# Patient Record
Sex: Male | Born: 1995 | Race: White | Hispanic: No | Marital: Married | Smoking: Never smoker
Health system: Southern US, Community
[De-identification: ages and names within clinical notes are randomized; demographics above are authoritative.]

## PROBLEM LIST (undated history)

## (undated) DIAGNOSIS — T7840XA Allergy, unspecified, initial encounter: Secondary | ICD-10-CM

## (undated) DIAGNOSIS — Z8709 Personal history of other diseases of the respiratory system: Secondary | ICD-10-CM

## (undated) DIAGNOSIS — J45909 Unspecified asthma, uncomplicated: Secondary | ICD-10-CM

## (undated) HISTORY — DX: Allergy, unspecified, initial encounter: T78.40XA

## (undated) HISTORY — DX: Personal history of other diseases of the respiratory system: Z87.09

## (undated) HISTORY — PX: TONSILLECTOMY: SUR1361

## (undated) HISTORY — PX: WISDOM TOOTH EXTRACTION: SHX21

## (undated) HISTORY — DX: Unspecified asthma, uncomplicated: J45.909

---
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
## 2018-11-22 ENCOUNTER — Encounter: Payer: Self-pay | Admitting: Family Medicine

## 2018-11-22 ENCOUNTER — Ambulatory Visit (INDEPENDENT_AMBULATORY_CARE_PROVIDER_SITE_OTHER): Payer: BC Managed Care – PPO | Admitting: Family Medicine

## 2018-11-22 ENCOUNTER — Ambulatory Visit: Payer: Self-pay | Admitting: Family Medicine

## 2018-11-22 VITALS — BP 128/80 | Temp 97.2°F | Ht 72.0 in | Wt 155.0 lb

## 2018-11-22 DIAGNOSIS — R531 Weakness: Secondary | ICD-10-CM | POA: Diagnosis not present

## 2018-11-22 DIAGNOSIS — R110 Nausea: Secondary | ICD-10-CM | POA: Diagnosis not present

## 2018-11-22 MED ORDER — ONDANSETRON HCL 8 MG PO TABS: 8.0000 mg | ORAL_TABLET | Freq: Three times a day (TID) | ORAL | 0 refills | Status: DC | PRN

## 2018-11-22 NOTE — Assessment & Plan Note (Signed)
See a/p for episodic weakness zofran  obs for other symptoms

## 2018-11-22 NOTE — Progress Notes (Signed)
Virtual Visit via Video Note  I connected with Alexander Ochoa on 11/22/18 at 10:15 AM EDT by a video enabled telemedicine application and verified that I am speaking with the correct person using two identifiers.  Location: Patient: home Provider: office    I discussed the limitations of evaluation and management by telemedicine and the availability of in person appointments. The patient expressed understanding and agreed to proceed.  History of Present Illness: Here to est as a new patient   No doctor since peds - new /needs new doctor   2 d ago he felt very shaky and weak and nausea  98.2 at that time  Ate- better for a bit and then got worse (ate olive garden pasta)  Clammy and pale  He had been outdoors - did not feel too hot / was not working  This lasted from 3 in afternoon until the next day  He had gone from 10-5 pm prior without eating  No LOC  Now just nausea  BP and blood sugar were elevated  BP was 156/75 Blood sugar was 155 (post prandial)   No hx of fainting  Blood draws make him anxious  Has never donated blood   Glucose today fasting 95  Yesterday it was 109 non fasting   Eats pretty healthy   Has not vomited  He can eat - makes him a bit worse  Heavy foods make it worse  No heartburn and does not get often  No abd pain  Epigastric area feels empty   So far has not eaten today  No more shakiness  No stool changes  No blood in stool   No pain with urination   No cough or respiratory symptoms at all  No ST  No rash  No tick bites   No joint pain or swelling  A little back pain -normal for him /upper back -on and off after standing   No headache  He was mildly light headed when he was shaky-has been better since then  No weakness or trouble speaking No numbness or tingling   Not anxious now or then   In the past he has had anxiety  Has never had medication Saw a counselor 1-2 times in HS    Stress level- is somewhat stressed  -exams His exams finished -big relief  Work- around 33 hours a week  This is a good amount  Stress at work- co workers difficult  Work load is high-under a lot of pressure   ? 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  HENT: Negative for congestion, sinus pain and sore throat.   Eyes: Negative for blurred vision, discharge and redness.  Respiratory: Negative for cough, shortness of breath and wheezing.   Cardiovascular: Negative for chest pain and palpitations.  Gastrointestinal: Positive for nausea. Negative for abdominal pain, blood in stool, constipation, diarrhea, heartburn, melena and vomiting.  Genitourinary: Negative for dysuria, frequency, hematuria and urgency.  Musculoskeletal: Negative for joint pain and myalgias.  Skin: Negative for rash.  Neurological: Negative for dizziness, tingling, speech change, focal weakness and headaches.  Endo/Heme/Allergies: Positive for environmental allergies. Negative for polydipsia. Does not bruise/bleed easily.  Psychiatric/Behavioral: Negative for depression and memory loss. The patient is nervous/anxious.    There are no active problems to display for this patient.  Past Medical History:  Diagnosis Date  . Allergy   . Asthma   . History of asthma     Social History   Tobacco Use  . Smoking status:  Never Smoker  . Smokeless tobacco: Never Used  Substance Use Topics  . Alcohol use: Never    Frequency: Never  . Drug use: Never   Family History  Problem Relation Age of Onset  . Hypertension Father   . High blood pressure Father   . Diabetes Father   . Hypertension Paternal Grandfather   . AAA (abdominal aortic aneurysm) Sister   . Breast cancer Maternal Aunt        under 30    No Known Allergies No current outpatient medications on file prior to visit.   No current facility-administered medications on file prior to visit.      Observations/Objective: Patient appears well, in no distress Weight is normal No facial swelling or asymmetry Normal voice-not hoarse and no slurred speech No obvious tremor or mobility impairment Moving neck and UEs normally Able to hear the call well  No cough or shortness of breath during interview  Talkative and mentally sharp with no cognitive changes No skin changes on face or neck , no rash or pallor Affect is mildly blunted (not overtly depressed or anxious appearing)   Assessment and Plan: Problem List Items Addressed This Visit      Other   Episodic weakness    One episode of non focal weakness/pallor/shakey/nausea (no vomiting) Improved with eating then worse again with glucose of 155 pp (now normal) and elevated bp (now normal)  Currently nausea alone (mild and no vomiting and no other symptoms)  Diff diag incl hypoglycemia/heat stroke (exhaustion)/stress reaction /anxiety  Disc all poss  Also cannot completely r/o virus like covid -19 (this is doubtful however) inst to isolate at home Px sent for zofran Enc fluids/rest (inside air conditioning)/ relax/ catch up on sleep  Smaller more frequent meals with protein-check blood glucose if needed  Consider 3 h GTT in the future (also f/u in office for exam)  Can return to work Thursday if feels 100% better  He will call us wed to update If worse in meantime will call and seek  care      Nausea    See a/p for episodic weakness zofran  obs for other symptoms         Follow Up Instructions: I am sending zofran in for nausea  Unsure what your symptoms came/are coming from  We do need to watch for viral symptoms of covid -19- please watch for diarrhea/vomiting/cough/shortness of breath or elevated temp and let us know  Hypoglycemia, heat stroke and anxiety could have all contributed as well  Once nausea is under control-drink fluids and stay cool (in the house) Eat smaller more frequent meals with protein  Keep me posted  Let's keep you out of work until Thursday  Call and update me with your symptom status on Wednesday (or earlier if worse)  Take care of yourself I would like to get a 3 hour glucose tolerance test in the future when we can set it up   I discussed the assessment and treatment plan with the patient. The patient was provided an opportunity to ask questions and all were answered. The patient agreed with the plan and demonstrated an understanding of the instructions.   The patient was advised to call back or seek an in-person evaluation if the symptoms worsen or if the condition fails to improve as anticipated.     Roxy Manns, MD

## 2018-11-22 NOTE — Assessment & Plan Note (Addendum)
One episode of non focal weakness/pallor/shakey/nausea (no vomiting) Improved with eating then worse again with glucose of 155 pp (now normal) and elevated bp (now normal)  Currently nausea alone (mild and no vomiting and no other symptoms)  Diff diag incl hypoglycemia/heat stroke (exhaustion)/stress reaction /anxiety  Disc all poss  Also cannot completely r/o virus like covid -19 (this is doubtful however) inst to isolate at home Px sent for zofran Enc fluids/rest (inside air conditioning)/ relax/ catch up on sleep  Smaller more frequent meals with protein-check blood glucose if needed  Consider 3 h GTT in the future (also f/u in office for exam)  Can return to work Thursday if feels 100% better  He will call us wed to update If worse in meantime will call and seek care

## 2018-11-22 NOTE — Patient Instructions (Signed)
I am sending zofran in for nausea  Unsure what your symptoms came/are coming from  We do need to watch for viral symptoms of covid -19- please watch for diarrhea/vomiting/cough/shortness of breath or elevated temp and let us know  Hypoglycemia, heat stroke and anxiety could have all contributed as well  Once nausea is under control-drink fluids and stay cool (in the house) Eat smaller more frequent meals with protein  Keep me posted  Let's keep you out of work until Thursday  Call and update me with your symptom status on Wednesday (or earlier if worse)  Take care of yourself I would like to get a 3 hour glucose tolerance test in the future when we can set it up

## 2019-09-12 ENCOUNTER — Telehealth: Payer: Self-pay | Admitting: Family Medicine

## 2019-09-12 DIAGNOSIS — Z0000 Encounter for general adult medical examination without abnormal findings: Secondary | ICD-10-CM | POA: Insufficient documentation

## 2019-09-12 NOTE — Telephone Encounter (Signed)
-----   Message from Aquilla Solian, RT sent at 09/01/2019  2:35 PM EST ----- Regarding: Lab Orders for Tuesday 3.9.2021 Please place lab orders for Tuesday 3.9.2021, office visit for physical on Tuesday 3.16.2021 Thank you, Jones Bales RT(R)

## 2019-09-13 ENCOUNTER — Other Ambulatory Visit: Payer: Self-pay

## 2019-09-13 ENCOUNTER — Other Ambulatory Visit (INDEPENDENT_AMBULATORY_CARE_PROVIDER_SITE_OTHER): Payer: BC Managed Care – PPO

## 2019-09-13 DIAGNOSIS — Z0000 Encounter for general adult medical examination without abnormal findings: Secondary | ICD-10-CM

## 2019-09-13 LAB — CBC WITH DIFFERENTIAL/PLATELET
Basophils Absolute: 0 10*3/uL (ref 0.0–0.1)
Basophils Relative: 0.7 % (ref 0.0–3.0)
Eosinophils Absolute: 0.1 10*3/uL (ref 0.0–0.7)
Eosinophils Relative: 1.3 % (ref 0.0–5.0)
HCT: 43.5 % (ref 39.0–52.0)
Hemoglobin: 14.8 g/dL (ref 13.0–17.0)
Lymphocytes Relative: 27.7 % (ref 12.0–46.0)
Lymphs Abs: 1.7 10*3/uL (ref 0.7–4.0)
MCHC: 34.1 g/dL (ref 30.0–36.0)
MCV: 89.7 fl (ref 78.0–100.0)
Monocytes Absolute: 0.5 10*3/uL (ref 0.1–1.0)
Monocytes Relative: 8 % (ref 3.0–12.0)
Neutro Abs: 3.7 10*3/uL (ref 1.4–7.7)
Neutrophils Relative %: 62.3 % (ref 43.0–77.0)
Platelets: 214 10*3/uL (ref 150.0–400.0)
RBC: 4.85 Mil/uL (ref 4.22–5.81)
RDW: 12.3 % (ref 11.5–15.5)
WBC: 6 10*3/uL (ref 4.0–10.5)

## 2019-09-13 LAB — COMPREHENSIVE METABOLIC PANEL
ALT: 18 U/L (ref 0–53)
AST: 19 U/L (ref 0–37)
Albumin: 4.9 g/dL (ref 3.5–5.2)
Alkaline Phosphatase: 62 U/L (ref 39–117)
BUN: 18 mg/dL (ref 6–23)
CO2: 29 mEq/L (ref 19–32)
Calcium: 10 mg/dL (ref 8.4–10.5)
Chloride: 102 mEq/L (ref 96–112)
Creatinine, Ser: 0.85 mg/dL (ref 0.40–1.50)
GFR: 111.27 mL/min (ref >60.00)
Glucose, Bld: 94 mg/dL (ref 70–99)
Potassium: 3.9 mEq/L (ref 3.5–5.1)
Sodium: 137 mEq/L (ref 135–145)
Total Bilirubin: 1.2 mg/dL (ref 0.2–1.2)
Total Protein: 7.7 g/dL (ref 6.0–8.3)

## 2019-09-13 LAB — LIPID PANEL
Cholesterol: 148 mg/dL (ref 0–200)
HDL: 61.8 mg/dL (ref >39.00)
LDL Cholesterol: 77 mg/dL (ref 0–99)
NonHDL: 86.16
Total CHOL/HDL Ratio: 2
Triglycerides: 47 mg/dL (ref 0.0–149.0)
VLDL: 9.4 mg/dL (ref 0.0–40.0)

## 2019-09-13 LAB — TSH: TSH: 1.75 u[IU]/mL (ref 0.35–4.50)

## 2019-09-20 ENCOUNTER — Encounter: Payer: Self-pay | Admitting: Family Medicine

## 2019-09-20 ENCOUNTER — Other Ambulatory Visit: Payer: Self-pay

## 2019-09-20 ENCOUNTER — Ambulatory Visit (INDEPENDENT_AMBULATORY_CARE_PROVIDER_SITE_OTHER): Payer: BC Managed Care – PPO | Admitting: Family Medicine

## 2019-09-20 VITALS — BP 130/80 | HR 73 | Temp 98.4°F | Ht 71.0 in | Wt 164.5 lb

## 2019-09-20 DIAGNOSIS — Z0000 Encounter for general adult medical examination without abnormal findings: Secondary | ICD-10-CM

## 2019-09-20 NOTE — Patient Instructions (Addendum)
Get a tetanus shot at work and let us know when it is   Go ahead and get the covid vaccines -now that you had covid over 3 months ago   Keep up the good work with healthy diet and exercise  Wear sun protection

## 2019-09-20 NOTE — Progress Notes (Signed)
Subjective:    Patient ID: Alexander Ochoa, male    DOB: 1995-10-11, 24 y.o.   MRN: [id number]  This visit occurred during the SARS-CoV-2 public health emergency.  Safety protocols were in place, including screening questions prior to the visit, additional usage of staff PPE, and extensive cleaning of exam room while observing appropriate contact time as indicated for disinfecting solutions.    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  Not a lot of time for anything else   Weight : Wt Readings from Last 3 Encounters:  09/20/19 164 lb 8 oz (74.6 kg)  11/22/18 155 lb (70.3 kg)   22.94 kg/m   Diet- eats pretty healthy  Has given up sweet drinks  Exercise - he does weight training when he can fit it in  More active in the spring /summer   HIV and STD screening -declines/low risk   Tetanus shot - plans to get at work   Flu shot - did not get this year but generally gets them every year   Family  History : father has lymphoma/ obesity/ DM /HTN  No colon or prostate cancer  Maunt had breast cancer   He feels like his LN go up and down in his neck   Had covid 19 in November  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)  BP Readings from Last 3 Encounters:  09/20/19 (!) 140/54  11/22/18 128/80  better on 2nd check  BP: 130/80   Pulse Readings from Last 3 Encounters:  09/20/19 73   Sometimes bp runs a little high at home  Usually stressed    Labs  Results for orders placed or performed in visit on 09/13/19  TSH  Result Value Ref Range   TSH 1.75 0.35 - 4.50 uIU/mL  Lipid panel  Result Value Ref Range   Cholesterol 148 0 - 200 mg/dL   Triglycerides 17.4 0.0 - 149.0 mg/dL   HDL 94.49 >67.59 mg/dL   VLDL 9.4 0.0 - 16.3 mg/dL   LDL Cholesterol 77 0 - 99 mg/dL   Total CHOL/HDL Ratio 2    NonHDL 86.16   Comprehensive metabolic panel  Result  Value Ref Range   Sodium 137 135 - 145 mEq/L   Potassium 3.9 3.5 - 5.1 mEq/L   Chloride 102 96 - 112 mEq/L   CO2 29 19 - 32 mEq/L   Glucose, Bld 94 70 - 99 mg/dL   BUN 18 6 - 23 mg/dL   Creatinine, Ser 8.46 0.40 - 1.50 mg/dL   Total Bilirubin 1.2 0.2 - 1.2 mg/dL   Alkaline Phosphatase 62 39 - 117 U/L   AST 19 0 - 37 U/L   ALT 18 0 - 53 U/L   Total Protein 7.7 6.0 - 8.3 g/dL   Albumin 4.9 3.5 - 5.2 g/dL   GFR 659.93 >57.01 mL/min   Calcium 10.0 8.4 - 10.5 mg/dL  CBC with Differential/Platelet  Result Value Ref Range   WBC 6.0 4.0 - 10.5 K/uL   RBC 4.85 4.22 - 5.81 Mil/uL   Hemoglobin 14.8 13.0 - 17.0 g/dL   HCT 77.9 39.0 - 30.0 %   MCV 89.7 78.0 - 100.0 fl   MCHC 34.1 30.0 - 36.0 g/dL   RDW 92.3 30.0 - 76.2 %   Platelets 214.0 150.0 - 400.0 K/uL   Neutrophils Relative %  62.3 43.0 - 77.0 %   Lymphocytes Relative 27.7 12.0 - 46.0 %   Monocytes Relative 8.0 3.0 - 12.0 %   Eosinophils Relative 1.3 0.0 - 5.0 %   Basophils Relative 0.7 0.0 - 3.0 %   Neutro Abs 3.7 1.4 - 7.7 K/uL   Lymphs Abs 1.7 0.7 - 4.0 K/uL   Monocytes Absolute 0.5 0.1 - 1.0 K/uL   Eosinophils Absolute 0.1 0.0 - 0.7 K/uL   Basophils Absolute 0.0 0.0 - 0.1 K/uL    Has some loss of interest /feeling down (worse at night)  Listens to music  Does not feel clinically depressed  No problems with sleep or appetite   No more weak spells   Patient Active Problem List   Diagnosis Date Noted  . Routine general medical examination at a health care facility 09/12/2019  . Episodic weakness 11/22/2018   Past Medical History:  Diagnosis Date  . Allergy   . Asthma   . History of asthma    Past Surgical History:  Procedure Laterality Date  . TONSILLECTOMY     age 33 or younger  . TONSILLECTOMY     Social History   Tobacco Use  . Smoking status: Never Smoker  . Smokeless tobacco: Never Used  Substance Use Topics  . Alcohol use: Never  . Drug use: Never   Family History  Problem Relation Age of Onset  .  Hypertension Father   . High blood pressure Father   . Diabetes Father   . Lymphoma Father   . Hypertension Paternal Grandfather   . Breast cancer Maternal Aunt        under 30    No Known Allergies Current Outpatient Medications on File Prior to Visit  Medication Sig Dispense Refill  . Ascorbic Acid (VITAMIN C PO) Take 1 tablet by mouth daily.    . Turmeric (QC TUMERIC COMPLEX PO) Take 1 tablet by mouth every other day.    Marland Kitchen VITAMIN D PO Take 1 tablet by mouth daily.     No current facility-administered medications on file prior to visit.    Review of Systems  Constitutional: Negative for activity change, appetite change, fatigue, fever and unexpected weight change.  HENT: Negative for congestion, rhinorrhea, sore throat and trouble swallowing.        Occ feels swollen LN? In neck  Eyes: Negative for pain, redness, itching and visual disturbance.  Respiratory: Negative for cough, chest tightness, shortness of breath and wheezing.   Cardiovascular: Negative for chest pain and palpitations.  Gastrointestinal: Negative for abdominal pain, blood in stool, constipation, diarrhea and nausea.  Endocrine: Negative for cold intolerance, heat intolerance, polydipsia and polyuria.  Genitourinary: Negative for difficulty urinating, dysuria, frequency and urgency.  Musculoskeletal: Negative for arthralgias, joint swelling and myalgias.  Skin: Negative for pallor and rash.  Neurological: Negative for dizziness, tremors, weakness, numbness and headaches.  Hematological: Negative for adenopathy. Does not bruise/bleed easily.  Psychiatric/Behavioral: Negative for decreased concentration and dysphoric mood. The patient is not nervous/anxious.        Objective:   Physical Exam Constitutional:      General: He is not in acute distress.    Appearance: Normal appearance. He is well-developed and normal weight. He is not ill-appearing or diaphoretic.  HENT:     Head: Normocephalic and atraumatic.       Right Ear: Tympanic membrane, ear canal and external ear normal.     Left Ear: Tympanic membrane, ear canal and external ear  normal.     Nose: Nose normal. No congestion.     Mouth/Throat:     Mouth: Mucous membranes are moist.     Pharynx: Oropharynx is clear. No posterior oropharyngeal erythema.  Eyes:     General: No scleral icterus.       Right eye: No discharge.        Left eye: No discharge.     Conjunctiva/sclera: Conjunctivae normal.     Pupils: Pupils are equal, round, and reactive to light.  Neck:     Thyroid: No thyromegaly.     Vascular: No carotid bruit or JVD.  Cardiovascular:     Rate and Rhythm: Normal rate and regular rhythm.     Pulses: Normal pulses.     Heart sounds: Normal heart sounds. No gallop.   Pulmonary:     Effort: Pulmonary effort is normal. No respiratory distress.     Breath sounds: Normal breath sounds. No wheezing or rales.     Comments: Good air exch Chest:     Chest wall: No tenderness.  Abdominal:     General: Bowel sounds are normal. There is no distension or abdominal bruit.     Palpations: Abdomen is soft. There is no mass.     Tenderness: There is no abdominal tenderness.     Hernia: No hernia is present.  Musculoskeletal:        General: No tenderness.     Cervical back: Normal range of motion and neck supple. No rigidity. No muscular tenderness.     Right lower leg: No edema.     Left lower leg: No edema.  Lymphadenopathy:     Head:     Right side of head: No submental, preauricular, posterior auricular or occipital adenopathy.     Left side of head: Submental adenopathy present. No preauricular, posterior auricular or occipital adenopathy.     Cervical: No cervical adenopathy.     Right cervical: No superficial, deep or posterior cervical adenopathy.    Left cervical: No superficial, deep or posterior cervical adenopathy.     Upper Body:     Right upper body: No supraclavicular, axillary or pectoral adenopathy.     Left upper  body: No supraclavicular, axillary, pectoral or epitrochlear adenopathy.     Lower Body: No right inguinal adenopathy. No left inguinal adenopathy.     Comments: One shotty mobile submental LN on the L   Skin:    General: Skin is warm and dry.     Coloration: Skin is not pale.     Findings: No erythema or rash.  Neurological:     Mental Status: He is alert.     Cranial Nerves: No cranial nerve deficit.     Motor: No abnormal muscle tone.     Coordination: Coordination normal.     Gait: Gait normal.     Deep Tendon Reflexes: Reflexes are normal and symmetric. Reflexes normal.  Psychiatric:        Mood and Affect: Mood normal.        Cognition and Memory: Cognition normal.           Assessment & Plan:   Problem List Items Addressed This Visit      Other   Routine general medical examination at a health care facility - Primary    Reviewed health habits including diet and exercise and skin cancer prevention Reviewed appropriate screening tests for age  Also reviewed health mt list, fam hx and immunization status ,  as well as social and family history    Good diet and exercise habits  No longer has any weakness spells Enc strongly to get the covid vaccines at work Also needs tetanus update-plans to get that  Usually gets flu shot yearly  Declines need for STD testing  BP here is better on 2nd check and will continue to monitor

## 2019-09-20 NOTE — Assessment & Plan Note (Signed)
Reviewed health habits including diet and exercise and skin cancer prevention Reviewed appropriate screening tests for age  Also reviewed health mt list, fam hx and immunization status , as well as social and family history    Good diet and exercise habits  No longer has any weakness spells Enc strongly to get the covid vaccines at work Also needs tetanus update-plans to get that  Usually gets flu shot yearly  Declines need for STD testing  BP here is better on 2nd check and will continue to monitor

## 2020-03-23 ENCOUNTER — Other Ambulatory Visit: Payer: Self-pay

## 2020-03-23 ENCOUNTER — Encounter: Payer: Self-pay | Admitting: Urgent Care

## 2020-03-23 ENCOUNTER — Other Ambulatory Visit: Admission: RE | Admit: 2020-03-23 | Payer: BC Managed Care – PPO | Source: Ambulatory Visit

## 2020-03-27 ENCOUNTER — Encounter: Admission: RE | Payer: Self-pay | Source: Home / Self Care

## 2020-03-27 ENCOUNTER — Ambulatory Visit: Admission: RE | Admit: 2020-03-27 | Payer: BC Managed Care – PPO | Source: Home / Self Care

## 2020-03-27 SURGERY — ESOPHAGOGASTRODUODENOSCOPY (EGD) WITH PROPOFOL
Anesthesia: General

## 2020-10-11 ENCOUNTER — Other Ambulatory Visit: Payer: Self-pay

## 2020-10-11 ENCOUNTER — Ambulatory Visit
Admission: EM | Admit: 2020-10-11 | Discharge: 2020-10-11 | Disposition: A | Discharge status: Home / Self Care | Payer: 59 | Attending: Family Medicine | Admitting: Family Medicine

## 2020-10-11 DIAGNOSIS — Z1152 Encounter for screening for COVID-19: Secondary | ICD-10-CM | POA: Diagnosis not present

## 2020-10-11 DIAGNOSIS — R509 Fever, unspecified: Secondary | ICD-10-CM | POA: Diagnosis not present

## 2020-10-11 DIAGNOSIS — B349 Viral infection, unspecified: Secondary | ICD-10-CM

## 2020-10-11 MED ORDER — ONDANSETRON 4 MG PO TBDP: 4.0000 mg | ORAL_TABLET | Freq: Once | ORAL | Status: DC

## 2020-10-11 MED ORDER — ACETAMINOPHEN 325 MG PO TABS: 650.0000 mg | ORAL_TABLET | Freq: Once | ORAL | Status: DC

## 2020-10-11 NOTE — ED Triage Notes (Signed)
Pt presents with HA, fatigue, cough, fever (102) at home x 1 day.  Has taken theraflu, dayquil, nyquil with some relief.

## 2020-10-11 NOTE — Discharge Instructions (Addendum)
This is most likely something viral.   Over-the-counter medicines include Tylenol, ibuprofen, Mucinex for symptoms. Rest, hydrate. Follow up as needed for continued or worsening symptoms

## 2020-10-11 NOTE — ED Provider Notes (Signed)
Alexander Ochoa    CSN: [id number] Arrival date & time: 10/11/20  7846      History   Chief Complaint No chief complaint on file.   HPI Alexander Ochoa is a 25 y.o. male.   Patient is a 25 year old male presents today with complaints of 1 day of fatigue, headache, body aches, fever, nausea, vomiting, cough.  Symptoms have been constant.  He has taken TheraFlu, DayQuil and NyQuil with some relief.  Wife has similar symptoms.     Past Medical History:  Diagnosis Date  . Allergy   . Asthma   . History of asthma     Patient Active Problem List   Diagnosis Date Noted  . Routine general medical examination at a health care facility 09/12/2019  . Episodic weakness 11/22/2018    Past Surgical History:  Procedure Laterality Date  . TONSILLECTOMY     age 89 or younger  . TONSILLECTOMY         Home Medications    Prior to Admission medications   Medication Sig Start Date End Date Taking? Authorizing Provider  VITAMIN D PO Take 1 tablet by mouth daily.   Yes [provider]    Family History Family History  Problem Relation Age of Onset  . Hypertension Father   . High blood pressure Father   . Diabetes Father   . Lymphoma Father   . Cancer Father   . Hypertension Paternal Grandfather   . Breast cancer Maternal Aunt        under 30   . Healthy Mother     Social History Social History   Tobacco Use  . Smoking status: Never Smoker  . Smokeless tobacco: Never Used  Vaping Use  . Vaping Use: Never used  Substance Use Topics  . Alcohol use: Never  . Drug use: Never     Allergies   Patient has no known allergies.   Review of Systems Review of Systems   Physical Exam Triage Vital Signs ED Triage Vitals  Enc Vitals Group     BP 10/11/20 0921 137/76     Pulse Rate 10/11/20 0921 (!) 105     Resp 10/11/20 0921 18     Temp 10/11/20 0921 (!) 101.4 F (38.6 C)     Temp Source 10/11/20 0921 Oral     SpO2 10/11/20 0921 95 %     Weight  --      Height --      Head Circumference --      Peak Flow --      Pain Score 10/11/20 0931 4     Pain Loc --      Pain Edu? --      Excl. in GC? --    No data found.  Updated Vital Signs BP 137/76 (BP Location: Left Arm)   Pulse (!) 105   Temp (!) 101.4 F (38.6 C) (Oral)   Resp 18   SpO2 95%   Visual Acuity Right Eye Distance:   Left Eye Distance:   Bilateral Distance:    Right Eye Near:   Left Eye Near:    Bilateral Near:     Physical Exam Vitals and nursing note reviewed.  Constitutional:      General: He is not in acute distress.    Appearance: Normal appearance. He is not ill-appearing, toxic-appearing or diaphoretic.  HENT:     Head: Normocephalic and atraumatic.     Right Ear: Tympanic membrane and  ear canal normal.     Left Ear: Tympanic membrane and ear canal normal.     Nose: Nose normal.     Mouth/Throat:     Pharynx: Oropharynx is clear.  Eyes:     Conjunctiva/sclera: Conjunctivae normal.  Cardiovascular:     Rate and Rhythm: Normal rate and regular rhythm.  Pulmonary:     Effort: Pulmonary effort is normal.     Breath sounds: Normal breath sounds.  Musculoskeletal:        General: Normal range of motion.     Cervical back: Normal range of motion.  Skin:    General: Skin is warm and dry.  Neurological:     Mental Status: He is alert.  Psychiatric:        Mood and Affect: Mood normal.      UC Treatments / Results  Labs (all labs ordered are listed, but only abnormal results are displayed) Labs Reviewed  COVID-19, FLU A+B NAA    EKG   Radiology No results found.  Procedures Procedures (including critical care time)  Medications Ordered in UC Medications  acetaminophen (TYLENOL) tablet 650 mg (has no administration in time range)  ondansetron (ZOFRAN-ODT) disintegrating tablet 4 mg (has no administration in time range)    Initial Impression / Assessment and Plan / UC Course  I have reviewed the triage vital signs and the  nursing notes.  Pertinent labs & imaging results that were available during my care of the patient were reviewed by me and considered in my medical decision making (see chart for details).     Viral illness Recommend over-the-counter medicines to include Tylenol, Profen, Mucinex for symptoms.  Rest, hydrate. Follow up as needed for continued or worsening symptoms Final Clinical Impressions(s) / UC Diagnoses   Final diagnoses:  Fever, unspecified  Encounter for screening for COVID-19  Viral illness     Discharge Instructions     This is most likely something viral.   Over-the-counter medicines include Tylenol, ibuprofen, Mucinex for symptoms. Rest, hydrate. Follow up as needed for continued or worsening symptoms     ED Prescriptions    None     PDMP not reviewed this encounter.   Janace Aris, NP 10/11/20 1009

## 2020-10-14 LAB — COVID-19, FLU A+B NAA
Influenza A, NAA: DETECTED — AB
Influenza B, NAA: NOT DETECTED
SARS-CoV-2, NAA: NOT DETECTED

## 2022-01-09 ENCOUNTER — Encounter: Payer: Self-pay | Admitting: Emergency Medicine

## 2022-01-09 ENCOUNTER — Ambulatory Visit
Admission: EM | Admit: 2022-01-09 | Discharge: 2022-01-09 | Disposition: A | Discharge status: Home / Self Care | Payer: No Typology Code available for payment source | Attending: Emergency Medicine | Admitting: Emergency Medicine

## 2022-01-09 DIAGNOSIS — J4521 Mild intermittent asthma with (acute) exacerbation: Secondary | ICD-10-CM

## 2022-01-09 MED ORDER — PREDNISONE 10 MG PO TABS: 40.0000 mg | ORAL_TABLET | Freq: Every day | ORAL | 0 refills | Status: AC

## 2022-01-09 MED ORDER — ALBUTEROL SULFATE HFA 108 (90 BASE) MCG/ACT IN AERS: 1.0000 | INHALATION_SPRAY | Freq: Four times a day (QID) | RESPIRATORY_TRACT | 0 refills | Status: AC | PRN

## 2022-01-09 NOTE — ED Provider Notes (Signed)
Alexander Ochoa    CSN: [id number] Arrival date & time: 01/09/22  1529      History   Chief Complaint Chief Complaint  Patient presents with   Cough   Shortness of Breath    HPI Alexander Ochoa is a 26 y.o. male.  Patient presents with 35-month history of intermittent cough and shortness of breath.  His symptoms are worse at night and in warm weather and with exercise.  He has noticed some wheezing at night.  He has history of asthma but has not required an inhaler in the past couple of years.  He denies fever, chills, ear pain, sore throat, chest pain, vomiting, diarrhea, or other symptoms.  No treatments at home.  His medical history includes asthma.   The history is provided by the patient and medical records.    Past Medical History:  Diagnosis Date   Allergy    Asthma    History of asthma     Patient Active Problem List   Diagnosis Date Noted   Routine general medical examination at a health care facility 09/12/2019   Episodic weakness 11/22/2018    Past Surgical History:  Procedure Laterality Date   TONSILLECTOMY     age 58 or younger   TONSILLECTOMY         Home Medications    Prior to Admission medications   Medication Sig Start Date End Date Taking? Authorizing Provider  albuterol (VENTOLIN HFA) 108 (90 Base) MCG/ACT inhaler Inhale 1-2 puffs into the lungs every 6 (six) hours as needed for wheezing or shortness of breath. 01/09/22  Yes Mickie Bail, NP  predniSONE (DELTASONE) 10 MG tablet Take 4 tablets (40 mg total) by mouth daily for 5 days. 01/09/22 01/14/22 Yes Mickie Bail, NP  VITAMIN D PO Take 1 tablet by mouth daily.    [provider]    Family History Family History  Problem Relation Age of Onset   Hypertension Father    High blood pressure Father    Diabetes Father    Lymphoma Father    Cancer Father    Hypertension Paternal Grandfather    Breast cancer Maternal Aunt        under 69    Healthy Mother     Social  History Social History   Tobacco Use   Smoking status: Never   Smokeless tobacco: Never  Vaping Use   Vaping Use: Never used  Substance Use Topics   Alcohol use: Never   Drug use: Never     Allergies   Patient has no known allergies.   Review of Systems Review of Systems  Constitutional:  Negative for chills and fever.  HENT:  Negative for ear pain and sore throat.   Respiratory:  Positive for cough, shortness of breath and wheezing.   Cardiovascular:  Negative for chest pain and palpitations.  Gastrointestinal:  Negative for diarrhea and vomiting.  Skin:  Negative for color change and rash.  All other systems reviewed and are negative.    Physical Exam Triage Vital Signs ED Triage Vitals  Enc Vitals Group     BP      Pulse      Resp      Temp      Temp src      SpO2      Weight      Height      Head Circumference      Peak Flow  Pain Score      Pain Loc      Pain Edu?      Excl. in GC?    No data found.  Updated Vital Signs BP (!) 142/80 (BP Location: Left Arm)   Pulse 82   Temp 98.2 F (36.8 C)   Resp 18   SpO2 98%   Visual Acuity Right Eye Distance:   Left Eye Distance:   Bilateral Distance:    Right Eye Near:   Left Eye Near:    Bilateral Near:     Physical Exam Vitals and nursing note reviewed.  Constitutional:      General: He is not in acute distress.    Appearance: Normal appearance. He is well-developed. He is not ill-appearing.  HENT:     Right Ear: Tympanic membrane normal.     Left Ear: Tympanic membrane normal.     Nose: Nose normal.     Mouth/Throat:     Mouth: Mucous membranes are moist.     Pharynx: Oropharynx is clear.  Cardiovascular:     Rate and Rhythm: Normal rate and regular rhythm.     Heart sounds: Normal heart sounds.  Pulmonary:     Effort: Pulmonary effort is normal. No respiratory distress.     Breath sounds: Normal breath sounds. No wheezing.  Musculoskeletal:     Cervical back: Neck supple.   Skin:    General: Skin is warm and dry.  Neurological:     Mental Status: He is alert.  Psychiatric:        Mood and Affect: Mood normal.        Behavior: Behavior normal.      UC Treatments / Results  Labs (all labs ordered are listed, but only abnormal results are displayed) Labs Reviewed - No data to display  EKG   Radiology No results found.  Procedures Procedures (including critical care time)  Medications Ordered in UC Medications - No data to display  Initial Impression / Assessment and Plan / UC Course  I have reviewed the triage vital signs and the nursing notes.  Pertinent labs & imaging results that were available during my care of the patient were reviewed by me and considered in my medical decision making (see chart for details).  Asthma exacerbation.  No respiratory distress, lungs are clear at this time, O2 sat 98% on room air.  Patient states his symptoms are worse at night and with exercise.  Treating today with albuterol inhaler and prednisone.  Education provided on asthma.  Instructed patient to follow-up with his PCP.  He agrees to plan of care.   Final Clinical Impressions(s) / UC Diagnoses   Final diagnoses:  Mild intermittent asthma with acute exacerbation     Discharge Instructions      Take the prednisone and use the albuterol inhaler as directed.  Follow up with your primary care provider.        ED Prescriptions     Medication Sig Dispense Auth. Provider   albuterol (VENTOLIN HFA) 108 (90 Base) MCG/ACT inhaler Inhale 1-2 puffs into the lungs every 6 (six) hours as needed for wheezing or shortness of breath. 18 g Mickie Bail, NP   predniSONE (DELTASONE) 10 MG tablet Take 4 tablets (40 mg total) by mouth daily for 5 days. 20 tablet Mickie Bail, NP      PDMP not reviewed this encounter.   Mickie Bail, NP 01/09/22 1610

## 2022-01-09 NOTE — ED Triage Notes (Signed)
Pt presents with cough and SOB when he workouts for a couple of months. His symptoms got worse after playing basketball last week. He has a history of asthma.

## 2022-01-09 NOTE — Discharge Instructions (Addendum)
Take the prednisone and use the albuterol inhaler as directed.  Follow up with your primary care provider.    

## 2022-02-19 ENCOUNTER — Ambulatory Visit
Admission: EM | Admit: 2022-02-19 | Discharge: 2022-02-19 | Disposition: A | Discharge status: Home / Self Care | Payer: No Typology Code available for payment source | Attending: Emergency Medicine | Admitting: Emergency Medicine

## 2022-02-19 DIAGNOSIS — H9201 Otalgia, right ear: Secondary | ICD-10-CM

## 2022-02-19 DIAGNOSIS — J029 Acute pharyngitis, unspecified: Secondary | ICD-10-CM

## 2022-02-19 LAB — POCT RAPID STREP A (OFFICE): Rapid Strep A Screen: NEGATIVE

## 2022-02-19 MED ORDER — LIDOCAINE VISCOUS HCL 2 % MT SOLN: 15.0000 mL | OROMUCOSAL | 0 refills | Status: DC | PRN

## 2022-02-19 NOTE — ED Provider Notes (Signed)
Alexander Ochoa    CSN: [id number] Arrival date & time: 02/19/22  1121      History   Chief Complaint Chief Complaint  Patient presents with   Sore Throat    HPI Alexander Ochoa is a 26 y.o. male.  Patient presents with 2-day history of sore throat and right ear pain.  No ear drainage, fever, chills, rash, cough, shortness of breath, vomiting, diarrhea, or other symptoms.  Treatment at home with Tylenol.  His medical history includes asthma and allergies.  The history is provided by the patient and medical records.    Past Medical History:  Diagnosis Date   Allergy    Asthma    History of asthma     Patient Active Problem List   Diagnosis Date Noted   Routine general medical examination at a health care facility 09/12/2019   Episodic weakness 11/22/2018    Past Surgical History:  Procedure Laterality Date   TONSILLECTOMY     age 76 or younger   TONSILLECTOMY         Home Medications    Prior to Admission medications   Medication Sig Start Date End Date Taking? Authorizing Provider  lidocaine (XYLOCAINE) 2 % solution Use as directed 15 mLs in the mouth or throat as needed for mouth pain. 02/19/22  Yes Mickie Bail, NP  albuterol (VENTOLIN HFA) 108 (90 Base) MCG/ACT inhaler Inhale 1-2 puffs into the lungs every 6 (six) hours as needed for wheezing or shortness of breath. 01/09/22   Mickie Bail, NP  VITAMIN D PO Take 1 tablet by mouth daily.    [provider]    Family History Family History  Problem Relation Age of Onset   Hypertension Father    High blood pressure Father    Diabetes Father    Lymphoma Father    Cancer Father    Hypertension Paternal Grandfather    Breast cancer Maternal Aunt        under 82    Healthy Mother     Social History Social History   Tobacco Use   Smoking status: Never   Smokeless tobacco: Never  Vaping Use   Vaping Use: Never used  Substance Use Topics   Alcohol use: Never   Drug use: Never      Allergies   Patient has no known allergies.   Review of Systems Review of Systems  Constitutional:  Negative for chills and fever.  HENT:  Positive for ear pain and sore throat. Negative for ear discharge.   Respiratory:  Negative for cough and shortness of breath.   Gastrointestinal:  Negative for diarrhea and vomiting.  Skin:  Negative for color change and rash.  All other systems reviewed and are negative.    Physical Exam Triage Vital Signs ED Triage Vitals [02/19/22 1128]  Enc Vitals Group     BP      Pulse      Resp      Temp      Temp src      SpO2      Weight      Height      Head Circumference      Peak Flow      Pain Score 5     Pain Loc      Pain Edu?      Excl. in GC?    No data found.  Updated Vital Signs BP 135/78   Pulse 87  Temp 98.1 F (36.7 C)   Resp 16   SpO2 98%   Visual Acuity Right Eye Distance:   Left Eye Distance:   Bilateral Distance:    Right Eye Near:   Left Eye Near:    Bilateral Near:     Physical Exam Vitals and nursing note reviewed.  Constitutional:      General: He is not in acute distress.    Appearance: Normal appearance. He is well-developed. He is not ill-appearing.  HENT:     Right Ear: Tympanic membrane normal.     Left Ear: Tympanic membrane normal.     Nose: Nose normal.     Mouth/Throat:     Mouth: Mucous membranes are moist.     Pharynx: Posterior oropharyngeal erythema present.  Cardiovascular:     Rate and Rhythm: Normal rate and regular rhythm.     Heart sounds: Normal heart sounds.  Pulmonary:     Effort: Pulmonary effort is normal. No respiratory distress.     Breath sounds: Normal breath sounds.  Musculoskeletal:     Cervical back: Neck supple.  Skin:    General: Skin is warm and dry.  Neurological:     Mental Status: He is alert.  Psychiatric:        Mood and Affect: Mood normal.        Behavior: Behavior normal.      UC Treatments / Results  Labs (all labs ordered are  listed, but only abnormal results are displayed) Labs Reviewed  POCT RAPID STREP A (OFFICE)    EKG   Radiology No results found.  Procedures Procedures (including critical care time)  Medications Ordered in UC Medications - No data to display  Initial Impression / Assessment and Plan / UC Course  I have reviewed the triage vital signs and the nursing notes.  Pertinent labs & imaging results that were available during my care of the patient were reviewed by me and considered in my medical decision making (see chart for details).   Sore throat, right otalgia.  Rapid strep negative.  Patient declines COVID test here; he will take one at home.  Discussed symptomatic treatment including viscous lidocaine, Tylenol or ibuprofen, rest, hydration.  Instructed patient to follow-up with his PCP if his symptoms are not improving.  Education provided on earache and sore throat.  Patient agrees to plan of care.  Final Clinical Impressions(s) / UC Diagnoses   Final diagnoses:  Sore throat  Acute otalgia, right     Discharge Instructions      Your strep test is negative.    Take Tylenol or ibuprofen as needed for discomfort.  Take the attached information on sore throat and earache.    Follow up with your primary care provider if your symptoms are not improving.        ED Prescriptions     Medication Sig Dispense Auth. Provider   lidocaine (XYLOCAINE) 2 % solution Use as directed 15 mLs in the mouth or throat as needed for mouth pain. 100 mL Mickie Bail, NP      PDMP not reviewed this encounter.   Mickie Bail, NP 02/19/22 1149

## 2022-02-19 NOTE — Discharge Instructions (Addendum)
Your strep test is negative.    Take Tylenol or ibuprofen as needed for discomfort.  Take the attached information on sore throat and earache.    Follow up with your primary care provider if your symptoms are not improving.

## 2022-02-19 NOTE — ED Triage Notes (Signed)
Pt presents with c/o sore throat and right ear pain for past couple of days

## 2022-06-22 ENCOUNTER — Ambulatory Visit (HOSPITAL_COMMUNITY)
Admission: RE | Admit: 2022-06-22 | Discharge: 2022-06-22 | Disposition: A | Discharge status: Home / Self Care | Payer: No Typology Code available for payment source | Source: Ambulatory Visit | Attending: Emergency Medicine | Admitting: Emergency Medicine

## 2022-06-22 ENCOUNTER — Encounter (HOSPITAL_COMMUNITY): Payer: Self-pay

## 2022-06-22 VITALS — BP 163/82 | HR 86 | Temp 99.5°F | Resp 18

## 2022-06-22 DIAGNOSIS — L0591 Pilonidal cyst without abscess: Secondary | ICD-10-CM | POA: Diagnosis not present

## 2022-06-22 MED ORDER — SULFAMETHOXAZOLE-TRIMETHOPRIM 800-160 MG PO TABS: 1.0000 | ORAL_TABLET | Freq: Two times a day (BID) | ORAL | 0 refills | Status: AC

## 2022-06-22 NOTE — ED Provider Notes (Signed)
[facility]    CSN: [id number] Arrival date & time: 06/22/22  1551      History   Chief Complaint Chief Complaint  Patient presents with   Abscess    Possible cyst on tail bone. Redness and swelling on area. Painful when lying and sitting. - Entered by patient   Covid Positive    HPI Alexander Ochoa is a 26 y.o. male.  Presents with 3-day history of possible abscess at the tailbone Reports pain, redness and feels swelling.  It hurts more with laying and sitting. No fevers.  Denies any trouble with bowel movements or urinating.  No weakness, numbness, tingling in the legs.  No history of abscess  Has tried ibuprofen and applying ice  Past Medical History:  Diagnosis Date   Allergy    Asthma    History of asthma     Patient Active Problem List   Diagnosis Date Noted   Routine general medical examination at a health care facility 09/12/2019   Episodic weakness 11/22/2018    Past Surgical History:  Procedure Laterality Date   TONSILLECTOMY     age 59 or younger   TONSILLECTOMY       Home Medications    Prior to Admission medications   Medication Sig Start Date End Date Taking? Authorizing Provider  sulfamethoxazole-trimethoprim (BACTRIM DS) 800-160 MG tablet Take 1 tablet by mouth 2 (two) times daily for 7 days. 06/22/22 06/29/22 Yes Tiara Maultsby, Lurena Joiner, PA-C  albuterol (VENTOLIN HFA) 108 (90 Base) MCG/ACT inhaler Inhale 1-2 puffs into the lungs every 6 (six) hours as needed for wheezing or shortness of breath. 01/09/22   Mickie Bail, NP  lidocaine (XYLOCAINE) 2 % solution Use as directed 15 mLs in the mouth or throat as needed for mouth pain. 02/19/22   Mickie Bail, NP  VITAMIN D PO Take 1 tablet by mouth daily.    [provider]    Family History Family History  Problem Relation Age of Onset   Hypertension Father    High blood pressure Father    Diabetes Father    Lymphoma Father    Cancer Father    Hypertension Paternal Grandfather     Breast cancer Maternal Aunt        under 45    Healthy Mother     Social History Social History   Tobacco Use   Smoking status: Never   Smokeless tobacco: Never  Vaping Use   Vaping Use: Never used  Substance Use Topics   Alcohol use: Never   Drug use: Never     Allergies   Patient has no known allergies.   Review of Systems Review of Systems Per HPI  Physical Exam Triage Vital Signs ED Triage Vitals  Enc Vitals Group     BP 06/22/22 1620 (!) 163/82     Pulse Rate 06/22/22 1620 86     Resp 06/22/22 1620 18     Temp 06/22/22 1620 99.5 F (37.5 C)     Temp Source 06/22/22 1620 Oral     SpO2 06/22/22 1620 97 %     Weight --      Height --      Head Circumference --      Peak Flow --      Pain Score 06/22/22 1619 7     Pain Loc --      Pain Edu? --      Excl. in GC? --    No  data found.  Updated Vital Signs BP (!) 163/82 (BP Location: Right Arm)   Pulse 86   Temp 99.5 F (37.5 C) (Oral)   Resp 18   SpO2 97%   Physical Exam Vitals and nursing note reviewed. Exam conducted with a chaperone present Hulan Saas CMA).  Constitutional:      General: He is not in acute distress.    Appearance: Normal appearance. He is not ill-appearing.  HENT:     Mouth/Throat:     Mouth: Mucous membranes are moist.     Pharynx: Oropharynx is clear.  Cardiovascular:     Rate and Rhythm: Normal rate and regular rhythm.  Pulmonary:     Effort: Pulmonary effort is normal.  Genitourinary:    Comments: Erythema at top of gluteal cleft, bilateral. There is small pin sized opening in the middle. No drainage. There is no fluctuance or induration. I do not palpate any mass beneath the skin Musculoskeletal:        General: Normal range of motion.     Cervical back: Normal range of motion.  Skin:      Neurological:     Mental Status: He is alert and oriented to person, place, and time.     UC Treatments / Results  Labs (all labs ordered are listed, but only abnormal  results are displayed) Labs Reviewed - No data to display  EKG  Radiology No results found.  Procedures Procedures   Medications Ordered in UC Medications - No data to display  Initial Impression / Assessment and Plan / UC Course  I have reviewed the triage vital signs and the nursing notes.  Pertinent labs & imaging results that were available during my care of the patient were reviewed by me and considered in my medical decision making (see chart for details).  Bactrim BID x 7 days Continue ibuprofen or tylenol for pain control, can do warm compress There is no drainable or superficial abscess on exam. Concern for deeper infection or fistula. He is well appearing here with stable vitals. Will try antibiotic for now but recommend follow up with gen surg for evaluation. ED precautions discussed. Patient agrees to plan.  Final Clinical Impressions(s) / UC Diagnoses   Final diagnoses:  Pilonidal cyst     Discharge Instructions      Please take medication as prescribed. Take with food to avoid upset stomach. Continue tylenol/ibuprofen for pain control. You can apply warm compress to the area to reduce swelling.  Please follow up with a general surgeon regarding your symptoms.  Please go to the emergency department if symptoms worsen.    ED Prescriptions     Medication Sig Dispense Auth. Provider   sulfamethoxazole-trimethoprim (BACTRIM DS) 800-160 MG tablet Take 1 tablet by mouth 2 (two) times daily for 7 days. 14 tablet Reyonna Haack, Lurena Joiner, PA-C      PDMP not reviewed this encounter.   Marlow Baars, Cordelia Poche 06/22/22 1731

## 2022-06-22 NOTE — ED Triage Notes (Signed)
Pt states he has an abscess on his tail bone x 3 days. He has been taking tylenol and IBU and using ice to help.    **Pt also COVID positive doesn't need to be seen just making aware, he is day 2**

## 2022-06-22 NOTE — Discharge Instructions (Addendum)
Please take medication as prescribed. Take with food to avoid upset stomach. Continue tylenol/ibuprofen for pain control. You can apply warm compress to the area to reduce swelling.  Please follow up with a general surgeon regarding your symptoms.  Please go to the emergency department if symptoms worsen.

## 2023-09-03 ENCOUNTER — Ambulatory Visit
Admission: EM | Admit: 2023-09-03 | Discharge: 2023-09-03 | Disposition: A | Discharge status: Home / Self Care | Payer: 59 | Attending: Emergency Medicine | Admitting: Emergency Medicine

## 2023-09-03 DIAGNOSIS — J069 Acute upper respiratory infection, unspecified: Secondary | ICD-10-CM

## 2023-09-03 DIAGNOSIS — K047 Periapical abscess without sinus: Secondary | ICD-10-CM | POA: Diagnosis not present

## 2023-09-03 MED ORDER — BENZONATATE 100 MG PO CAPS: 200.0000 mg | ORAL_CAPSULE | Freq: Three times a day (TID) | ORAL | 0 refills | Status: DC

## 2023-09-03 MED ORDER — PROMETHAZINE-DM 6.25-15 MG/5ML PO SYRP: 5.0000 mL | ORAL_SOLUTION | Freq: Four times a day (QID) | ORAL | 0 refills | Status: DC | PRN

## 2023-09-03 MED ORDER — AMOXICILLIN-POT CLAVULANATE 875-125 MG PO TABS: 1.0000 | ORAL_TABLET | Freq: Two times a day (BID) | ORAL | 0 refills | Status: AC

## 2023-09-03 MED ORDER — IPRATROPIUM BROMIDE 0.06 % NA SOLN: 2.0000 | Freq: Four times a day (QID) | NASAL | 12 refills | Status: DC

## 2023-09-03 NOTE — ED Triage Notes (Signed)
 Sx started yesterday  Toothache on upper right hand side Sore throat Cough Nausea  Runny nose

## 2023-09-03 NOTE — Discharge Instructions (Signed)
 Take the Augmentin twice daily with food for 7 days for treatment of your dental infection.  Use over-the-counter Tylenol and ibuprofen for swelling and mild to moderate pain.  Rinse with warm salt water, or Listerine, after each meal to remove food particles and wash away any pus that is collecting.  If you develop any increasing or swelling, fever, pain, or difficulty swallowing you to go to the emergency department at [facility] with a have an oral surgeon and also a dentist on-call.   Use the Atrovent nasal spray, 2 squirts in each nostril every 6 hours, as needed for runny nose and postnasal drip.  Use the Tessalon Perles every 8 hours during the day.  Take them with a small sip of water.  They may give you some numbness to the base of your tongue or a metallic taste in your mouth, this is normal.  Use the Promethazine DM cough syrup at bedtime for cough and congestion.  It will make you drowsy so do not take it during the day.  Return for reevaluation or see your primary care provider for any new or worsening symptoms.

## 2023-09-03 NOTE — ED Provider Notes (Signed)
 [facility]    CSN: [id number] Arrival date & time: 09/03/23  1909      History   Chief Complaint Chief Complaint  Patient presents with   Cough   Sore Throat   Dental Pain   Nausea    HPI Alexander Ochoa is a 28 y.o. male.   HPI  28 year old male with past medical history significant for asthma presents for evaluation of respiratory symptoms that started last night.  He reports that he had pain in his right upper rear molar that started yesterday along with a sore throat.  This morning he developed runny nose, nausea, and a cough.  He denies any fever.  Past Medical History:  Diagnosis Date   Allergy    Asthma    History of asthma     Patient Active Problem List   Diagnosis Date Noted   Routine general medical examination at a health care facility 09/12/2019   Episodic weakness 11/22/2018    Past Surgical History:  Procedure Laterality Date   TONSILLECTOMY     age 66 or younger   TONSILLECTOMY         Home Medications    Prior to Admission medications   Medication Sig Start Date End Date Taking? Authorizing Provider  amoxicillin-clavulanate (AUGMENTIN) 875-125 MG tablet Take 1 tablet by mouth every 12 (twelve) hours for 7 days. 09/03/23 09/10/23 Yes Becky Augusta, NP  benzonatate (TESSALON) 100 MG capsule Take 2 capsules (200 mg total) by mouth every 8 (eight) hours. 09/03/23  Yes Becky Augusta, NP  ipratropium (ATROVENT) 0.06 % nasal spray Place 2 sprays into both nostrils 4 (four) times daily. 09/03/23  Yes Becky Augusta, NP  promethazine-dextromethorphan (PROMETHAZINE-DM) 6.25-15 MG/5ML syrup Take 5 mLs by mouth 4 (four) times daily as needed. 09/03/23  Yes Becky Augusta, NP  albuterol (VENTOLIN HFA) 108 (90 Base) MCG/ACT inhaler Inhale 1-2 puffs into the lungs every 6 (six) hours as needed for wheezing or shortness of breath. 01/09/22   Mickie Bail, NP  lidocaine (XYLOCAINE) 2 % solution Use as directed 15 mLs in the mouth or throat as needed for mouth  pain. 02/19/22   Mickie Bail, NP  VITAMIN D PO Take 1 tablet by mouth daily.    [provider]    Family History Family History  Problem Relation Age of Onset   Hypertension Father    High blood pressure Father    Diabetes Father    Lymphoma Father    Cancer Father    Hypertension Paternal Grandfather    Breast cancer Maternal Aunt        under 61    Healthy Mother     Social History Social History   Tobacco Use   Smoking status: Never   Smokeless tobacco: Never  Vaping Use   Vaping status: Never Used  Substance Use Topics   Alcohol use: Never   Drug use: Never     Allergies   Patient has no known allergies.   Review of Systems Review of Systems  Constitutional:  Negative for fever.  HENT:  Positive for congestion, dental problem, rhinorrhea and sore throat. Negative for ear pain.   Respiratory:  Positive for cough. Negative for shortness of breath and wheezing.      Physical Exam Triage Vital Signs ED Triage Vitals  Encounter Vitals Group     BP      Systolic BP Percentile      Diastolic BP Percentile  Pulse      Resp      Temp      Temp src      SpO2      Weight      Height      Head Circumference      Peak Flow      Pain Score      Pain Loc      Pain Education      Exclude from Growth Chart    No data found.  Updated Vital Signs BP (!) 158/78 (BP Location: Right Arm)   Pulse 84   Temp 98.9 F (37.2 C) (Oral)   Resp 18   SpO2 97%   Visual Acuity Right Eye Distance:   Left Eye Distance:   Bilateral Distance:    Right Eye Near:   Left Eye Near:    Bilateral Near:     Physical Exam Vitals and nursing note reviewed.  Constitutional:      Appearance: Normal appearance. He is not ill-appearing.  HENT:     Head: Normocephalic and atraumatic.     Right Ear: Tympanic membrane, ear canal and external ear normal. There is no impacted cerumen.     Left Ear: Tympanic membrane, ear canal and external ear normal. There is no  impacted cerumen.     Nose: Congestion and rhinorrhea present.     Comments: Nasal mucosa is erythematous and edematous with clear discharge in both nares.    Mouth/Throat:     Mouth: Mucous membranes are moist.     Pharynx: Oropharynx is clear. Posterior oropharyngeal erythema present. No oropharyngeal exudate.     Comments: Tonsillar pillars are unremarkable.  Posterior oropharynx demonstrates erythema with clear postnasal drip.  Patient's right upper premolar is nontender to percussion but the surrounding gum tissue is erythematous and there is milky white discharge coming from around the tooth. Cardiovascular:     Rate and Rhythm: Normal rate and regular rhythm.     Pulses: Normal pulses.     Heart sounds: Normal heart sounds. No murmur heard.    No friction rub. No gallop.  Pulmonary:     Effort: Pulmonary effort is normal.     Breath sounds: Normal breath sounds. No wheezing, rhonchi or rales.  Musculoskeletal:     Cervical back: Normal range of motion and neck supple. No tenderness.  Lymphadenopathy:     Cervical: No cervical adenopathy.  Skin:    General: Skin is warm and dry.     Capillary Refill: Capillary refill takes less than 2 seconds.     Findings: No rash.  Neurological:     General: No focal deficit present.     Mental Status: He is alert and oriented to person, place, and time.      UC Treatments / Results  Labs (all labs ordered are listed, but only abnormal results are displayed) Labs Reviewed - No data to display  EKG   Radiology No results found.  Procedures Procedures (including critical care time)  Medications Ordered in UC Medications - No data to display  Initial Impression / Assessment and Plan / UC Course  I have reviewed the triage vital signs and the nursing notes.  Pertinent labs & imaging results that were available during my care of the patient were reviewed by me and considered in my medical decision making (see chart for  details).   Patient is a nontoxic-appearing 28 year old male presenting for evaluation of respiratory symptoms and dental symptoms as  outlined HPI above.  His physical exam does reveal inflammation of his nasal mucosa with clear rhinorrhea.  Also erythema to the posterior pharynx with clear postnasal drip.  His tonsillar pillars are unremarkable.  He does have erythema to the gum tissue surrounding his right upper rear molar with a milky white discharge coming from the gumline around the tooth.  The tooth is nontender to percussion.  No dental caries or fractures noted.  No cervical adenopathy present on exam.  Cardiopulmonary exam reveals: Sounds in all fields.  The patient exam is consistent with an upper respiratory tract, most likely viral.  However, he does have what appears to be a draining dental infection.  I will discharge him home on Augmentin 875 mg twice daily with food for 7 days for his dental infection and prescribe Atrovent nasal spray to help the nasal congestion, Tessalon Perles) DM cough syrup for cough and congestion.  Return precautions reviewed.   Final Clinical Impressions(s) / UC Diagnoses   Final diagnoses:  Viral URI with cough  Dental abscess     Discharge Instructions      Take the Augmentin twice daily with food for 7 days for treatment of your dental infection.  Use over-the-counter Tylenol and ibuprofen for swelling and mild to moderate pain.  Rinse with warm salt water, or Listerine, after each meal to remove food particles and wash away any pus that is collecting.  If you develop any increasing or swelling, fever, pain, or difficulty swallowing you to go to the emergency department at [facility] with a have an oral surgeon and also a dentist on-call.   Use the Atrovent nasal spray, 2 squirts in each nostril every 6 hours, as needed for runny nose and postnasal drip.  Use the Tessalon Perles every 8 hours during the day.  Take them with a small sip of  water.  They may give you some numbness to the base of your tongue or a metallic taste in your mouth, this is normal.  Use the Promethazine DM cough syrup at bedtime for cough and congestion.  It will make you drowsy so do not take it during the day.  Return for reevaluation or see your primary care provider for any new or worsening symptoms.      ED Prescriptions     Medication Sig Dispense Auth. Provider   amoxicillin-clavulanate (AUGMENTIN) 875-125 MG tablet Take 1 tablet by mouth every 12 (twelve) hours for 7 days. 14 tablet Becky Augusta, NP   benzonatate (TESSALON) 100 MG capsule Take 2 capsules (200 mg total) by mouth every 8 (eight) hours. 21 capsule Becky Augusta, NP   ipratropium (ATROVENT) 0.06 % nasal spray Place 2 sprays into both nostrils 4 (four) times daily. 15 mL Becky Augusta, NP   promethazine-dextromethorphan (PROMETHAZINE-DM) 6.25-15 MG/5ML syrup Take 5 mLs by mouth 4 (four) times daily as needed. 118 mL Becky Augusta, NP      PDMP not reviewed this encounter.   Becky Augusta, NP 09/03/23 [phone]

## 2024-04-15 ENCOUNTER — Ambulatory Visit: Admitting: Physician Assistant

## 2024-04-15 ENCOUNTER — Encounter: Payer: Self-pay | Admitting: Physician Assistant

## 2024-04-15 VITALS — BP 128/82 | HR 63 | Temp 97.6°F | Ht 71.0 in | Wt 185.0 lb

## 2024-04-15 DIAGNOSIS — J4520 Mild intermittent asthma, uncomplicated: Secondary | ICD-10-CM | POA: Insufficient documentation

## 2024-04-15 DIAGNOSIS — Z23 Encounter for immunization: Secondary | ICD-10-CM | POA: Diagnosis not present

## 2024-04-15 DIAGNOSIS — Z0000 Encounter for general adult medical examination without abnormal findings: Secondary | ICD-10-CM

## 2024-04-15 NOTE — Progress Notes (Signed)
 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 He has a fairly nutritive diet. Exercises at the gym 2-3x per week and is on his feet throughout the day. Married.   Last Physical: 2021 Last Dental Exam: 2wk Last Eye Exam: 33m  Immunizations Due: Flu, Prevnar 20, Tdap  Suspected lymph node of left inguinal area noticed about 2 months ago, nontender, fluctuates in size. Father has NHL.  Medication list has been reviewed and updated.  Current Meds  Medication Sig   albuterol  (VENTOLIN  HFA) 108 (90 Base) MCG/ACT inhaler Inhale 1-2 puffs into the lungs every 6 (six) hours as needed for wheezing or shortness of breath.   MAGNESIUM PO Take by mouth.   VITAMIN D PO Take 1 tablet by mouth daily.     Review of Systems  Patient Active Problem List   Diagnosis Date Noted   Mild intermittent asthma 04/15/2024    No Known Allergies  Immunization History  Administered Date(s) Administered   HPV 9-valent 11/21/2010, 02/13/2011, 03/03/2014   Hepatitis B, PED/ADOLESCENT 04/06/1996, 05/04/1996, 01/04/1997   Influenza, Seasonal, Injecte, Preservative Fre 04/15/2024   Influenza,inj,Quad PF,6+ Mos 04/03/2018   PNEUMOCOCCAL CONJUGATE-20 04/15/2024   Tdap 03/05/2007, 04/15/2024    Past Surgical History:  Procedure Laterality Date   TONSILLECTOMY     age 13 or younger   TONSILLECTOMY     WISDOM TOOTH EXTRACTION      Social History   Tobacco Use   Smoking status: Never   Smokeless tobacco: Never  Vaping Use   Vaping status: Never Used  Substance Use Topics   Alcohol use: Never   Drug use: Never    Family History  Problem Relation Age of Onset   Healthy Mother    Hypertension Father    High blood pressure Father    Diabetes Father    Non-Hodgkin's lymphoma Father    Stroke Paternal Grandfather     Hypertension Paternal Grandfather    Breast cancer Maternal Aunt        under 30         04/15/2024    1:13 PM  GAD 7 : Generalized Anxiety Score  Nervous, Anxious, on Edge 0  Control/stop worrying 0  Worry too much - different things 0  Trouble relaxing 0  Restless 0  Easily annoyed or irritable 1  Afraid - awful might happen 0  Total GAD 7 Score 1  Anxiety Difficulty Not difficult at all       04/15/2024    1:13 PM 09/20/2019    9:10 AM  Depression screen PHQ 2/9  Decreased Interest 0 2  Down, Depressed, Hopeless 0 2  PHQ - 2 Score 0 4  Altered sleeping  0  Tired, decreased energy  1  Change in appetite  0  Feeling bad or failure about yourself   1  Trouble concentrating  0  Moving slowly or fidgety/restless  0  Suicidal thoughts  0  PHQ-9 Score  6  Difficult doing work/chores  Somewhat difficult    BP Readings from Last 3 Encounters:  04/15/24 128/82  09/03/23 (!) 158/78  06/22/22 (!) 163/82    Wt Readings from Last 3 Encounters:  04/15/24 185 lb (83.9 kg)  09/20/19 164 lb 8 oz (  74.6 kg)  11/22/18 155 lb (70.3 kg)    BP 128/82   Pulse 63   Temp 97.6 F (36.4 C)   Ht 5' 11 (1.803 m)   Wt 185 lb (83.9 kg)   SpO2 95%   BMI 25.80 kg/m   Physical Exam Vitals and nursing note reviewed.  Constitutional:      Appearance: Normal appearance.  HENT:     Ears:     Comments: EAC clear bilaterally with good view of TM which is without effusion or erythema.     Nose: Nose normal.     Mouth/Throat:     Mouth: Mucous membranes are moist. No oral lesions.     Dentition: Normal dentition.     Pharynx: No posterior oropharyngeal erythema.  Eyes:     Extraocular Movements: Extraocular movements intact.     Conjunctiva/sclera: Conjunctivae normal.     Pupils: Pupils are equal, round, and reactive to light.  Neck:     Thyroid: No thyromegaly.  Cardiovascular:     Rate and Rhythm: Normal rate and regular rhythm.     Heart sounds: No murmur heard.    No  friction rub. No gallop.     Comments: Pulses 2+ at radial, PT, DP bilaterally. No carotid bruit. No peripheral edema Pulmonary:     Effort: Pulmonary effort is normal.     Breath sounds: Normal breath sounds.  Abdominal:     General: Bowel sounds are normal.     Palpations: Abdomen is soft. There is no mass.     Tenderness: There is no abdominal tenderness.  Genitourinary:    Penis: Normal and circumcised.      Testes: Normal.        Right: Mass not present.        Left: Mass not present.  Musculoskeletal:     Comments: Full ROM with strength 5/5 bilateral upper and lower extremities  Lymphadenopathy:     Cervical: No cervical adenopathy.     Lower Body: No right inguinal adenopathy. Left inguinal adenopathy present.     Comments: 4 mm mobile rounded mass of left inguinal crease likely lymph node, not particularly tender.  Skin:    General: Skin is warm.     Capillary Refill: Capillary refill takes less than 2 seconds.     Findings: No lesion or rash.     Comments: 3 mm skin tag upper left back  Neurological:     Mental Status: He is alert and oriented to person, place, and time.     Gait: Gait is intact.  Psychiatric:        Mood and Affect: Mood normal.        Behavior: Behavior normal.     Recent Labs     Component Value Date/Time   NA 137 09/13/2019 0851   K 3.9 09/13/2019 0851   CL 102 09/13/2019 0851   CO2 29 09/13/2019 0851   GLUCOSE 94 09/13/2019 0851   BUN 18 09/13/2019 0851   CREATININE 0.85 09/13/2019 0851   CALCIUM 10.0 09/13/2019 0851   PROT 7.7 09/13/2019 0851   ALBUMIN 4.9 09/13/2019 0851   AST 19 09/13/2019 0851   ALT 18 09/13/2019 0851   ALKPHOS 62 09/13/2019 0851   BILITOT 1.2 09/13/2019 0851    Lab Results  Component Value Date   WBC 6.0 09/13/2019   HGB 14.8 09/13/2019   HCT 43.5 09/13/2019   MCV 89.7 09/13/2019   PLT 214.0 09/13/2019   No  results found for: HGBA1C Lab Results  Component Value Date   CHOL 148 09/13/2019   HDL  61.80 09/13/2019   LDLCALC 77 09/13/2019   TRIG 47.0 09/13/2019   CHOLHDL 2 09/13/2019   Lab Results  Component Value Date   TSH 1.75 09/13/2019      Assessment and Plan:  1. Annual physical exam (Primary) Encouraged healthy lifestyle including regular physical activity and consumption of whole fruits and vegetables. Encouraged routine dental and eye exams. Vaccinations up to date.   - CBC with Differential/Platelet - Comprehensive metabolic panel with GFR - TSH  2. Encounter for immunization Patient given flu, Tdap, and Prevnar 20 today.   - Flu vaccine trivalent PF, 6mos and older(Flulaval,Afluria,Fluarix,Fluzone) - Tdap vaccine greater than or equal to 7yo IM - Pneumococcal conjugate vaccine 20-valent     F/u 2-3 mo to recheck inguinal lymphadenopathy, sooner if clearly enlarging.  F/u 1y CPE   Rolan Hoyle, PA-C, DMSc, Nutritionist [facility] and [facility] [phone]

## 2024-04-16 LAB — CBC WITH DIFFERENTIAL/PLATELET
Basophils Absolute: 0 x10E3/uL (ref 0.0–0.2)
Basos: 1 %
EOS (ABSOLUTE): 0.1 x10E3/uL (ref 0.0–0.4)
Eos: 1 %
Hematocrit: 45.6 % (ref 37.5–51.0)
Hemoglobin: 15.1 g/dL (ref 13.0–17.7)
Immature Grans (Abs): 0 x10E3/uL (ref 0.0–0.1)
Immature Granulocytes: 0 %
Lymphocytes Absolute: 2.9 x10E3/uL (ref 0.7–3.1)
Lymphs: 33 %
MCH: 30 pg (ref 26.6–33.0)
MCHC: 33.1 g/dL (ref 31.5–35.7)
MCV: 91 fL (ref 79–97)
Monocytes Absolute: 0.6 x10E3/uL (ref 0.1–0.9)
Monocytes: 7 %
Neutrophils Absolute: 5.1 x10E3/uL (ref 1.4–7.0)
Neutrophils: 58 %
Platelets: 265 x10E3/uL (ref 150–450)
RBC: 5.03 x10E6/uL (ref 4.14–5.80)
RDW: 12.2 % (ref 11.6–15.4)
WBC: 8.8 x10E3/uL (ref 3.4–10.8)

## 2024-04-16 LAB — COMPREHENSIVE METABOLIC PANEL WITH GFR
ALT: 15 IU/L (ref 0–44)
AST: 25 IU/L (ref 0–40)
Albumin: 5.1 g/dL (ref 4.3–5.2)
Alkaline Phosphatase: 80 IU/L (ref 47–123)
BUN/Creatinine Ratio: 19 (ref 9–20)
BUN: 17 mg/dL (ref 6–20)
Bilirubin Total: 0.7 mg/dL (ref 0.0–1.2)
CO2: 20 mmol/L (ref 20–29)
Calcium: 10.1 mg/dL (ref 8.7–10.2)
Chloride: 101 mmol/L (ref 96–106)
Creatinine, Ser: 0.9 mg/dL (ref 0.76–1.27)
Globulin, Total: 2.7 g/dL (ref 1.5–4.5)
Glucose: 96 mg/dL (ref 70–99)
Potassium: 4 mmol/L (ref 3.5–5.2)
Sodium: 139 mmol/L (ref 134–144)
Total Protein: 7.8 g/dL (ref 6.0–8.5)
eGFR: 119 mL/min/1.73 (ref >59)

## 2024-04-16 LAB — TSH: TSH: 1.02 u[IU]/mL (ref 0.450–4.500)

## 2024-04-18 ENCOUNTER — Ambulatory Visit: Payer: Self-pay | Admitting: Physician Assistant

## 2024-04-29 NOTE — Telephone Encounter (Signed)
 Please review patient response.  JM

## 2024-07-15 ENCOUNTER — Ambulatory Visit: Admitting: Physician Assistant

## 2024-07-27 ENCOUNTER — Encounter: Payer: Self-pay | Admitting: Physician Assistant

## 2024-07-27 ENCOUNTER — Ambulatory Visit (INDEPENDENT_AMBULATORY_CARE_PROVIDER_SITE_OTHER): Admitting: Physician Assistant

## 2024-07-27 VITALS — BP 122/78 | HR 69 | Temp 98.5°F | Ht 71.0 in | Wt 190.0 lb

## 2024-07-27 DIAGNOSIS — R599 Enlarged lymph nodes, unspecified: Secondary | ICD-10-CM | POA: Diagnosis not present

## 2024-07-27 DIAGNOSIS — L649 Androgenic alopecia, unspecified: Secondary | ICD-10-CM | POA: Insufficient documentation

## 2024-07-27 MED ORDER — MINOXIDIL 5 % EX FOAM: 1.0000 | Freq: Every day | CUTANEOUS | 2 refills | Status: AC

## 2024-07-27 NOTE — Assessment & Plan Note (Signed)
 Patient reassured of nonconcerning texture and size of existing lymph nodes.  Follow clinically.  No need to repeat labs today but probably will recheck CBC at his physical with me in the fall.

## 2024-07-27 NOTE — Progress Notes (Signed)
 "   Date:  07/27/2024   Name:  Alexander Ochoa   DOB:  04-29-1996   MRN:  [id number]   Chief Complaint: Medical Management of Chronic Issues (lymph node f/u- the same as last visit- has not noticed any changes)  HPI  Alexander Ochoa returns today for 40-month follow-up on palpable lymph node in the left inguinal crease, unchanged from last visit.  CBC last time with normal lymphocyte and total WBC count, although toward the upper end of normal.  Patient also mentions other tiny palpable lymph nodes in his left posterior neck and right inguinal crease.  Overall he feels well, good energy and appetite, sleeping fine, no unexpected changes in weight.  Family history NHL in father.  Medication list has been reviewed and updated.  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   Mild intermittent asthma 04/15/2024    Allergies[2]  Immunization History  Administered Date(s) Administered   HPV 9-valent 11/21/2010, 02/13/2011, 03/03/2014   Hepatitis B, PED/ADOLESCENT 04/06/1996, 05/04/1996, 01/04/1997   Influenza, Seasonal, Injecte, Preservative Fre 04/15/2024   Influenza,inj,Quad PF,6+ Mos 04/03/2018   PNEUMOCOCCAL CONJUGATE-20 04/15/2024   Tdap 03/05/2007, 04/15/2024    Past Surgical History:  Procedure Laterality Date   TONSILLECTOMY     age 5 or younger   TONSILLECTOMY     WISDOM TOOTH EXTRACTION      Social History[3]  Family History  Problem Relation Age of Onset   Healthy Mother    Hypertension Father    High blood pressure Father    Diabetes Father    Non-Hodgkin's lymphoma Father    Stroke Paternal Grandfather    Hypertension Paternal Grandfather    Breast cancer Maternal Aunt        under 30         07/27/2024    2:31 PM 04/15/2024    1:13 PM  GAD 7 : Generalized Anxiety Score  Nervous, Anxious, on Edge 0 0   Control/stop worrying 0 0   Worry too much -  different things 0 0   Trouble relaxing 0 0   Restless 0 0   Easily annoyed or irritable 0 1   Afraid - awful might happen 0 0   Total GAD 7 Score 0 1  Anxiety Difficulty Not difficult at all Not difficult at all     Data saved with a previous flowsheet row definition       07/27/2024    2:31 PM 04/15/2024    1:13 PM 09/20/2019    9:10 AM  Depression screen PHQ 2/9  Decreased Interest 0 0 2  Down, Depressed, Hopeless 0 0 2  PHQ - 2 Score 0 0 4  Altered sleeping   0  Tired, decreased energy   1  Change in appetite   0  Feeling bad or failure about yourself    1  Trouble concentrating   0  Moving slowly or fidgety/restless   0  Suicidal thoughts   0  PHQ-9 Score   6   Difficult doing work/chores   Somewhat difficult     Data saved with a previous flowsheet row definition    BP Readings from Last 3 Encounters:  07/27/24 122/78  04/15/24 128/82  09/03/23 (!) 158/78    Wt Readings from Last 3 Encounters:  07/27/24 190 lb (86.2 kg)  04/15/24 185 lb (83.9 kg)  09/20/19  164 lb 8 oz (74.6 kg)    BP 122/78   Pulse 69   Temp 98.5 F (36.9 C)   Ht 5' 11 (1.803 m)   Wt 190 lb (86.2 kg)   SpO2 99%   BMI 26.50 kg/m   Physical Exam Vitals and nursing note reviewed.  Constitutional:      Appearance: Normal appearance.  Cardiovascular:     Rate and Rhythm: Normal rate.  Pulmonary:     Effort: Pulmonary effort is normal.  Abdominal:     General: There is no distension.  Musculoskeletal:        General: Normal range of motion.  Lymphadenopathy:     Comments: 4 mm mobile nontender rounded mass of left inguinal crease likely lymph node. Similar but smaller masses noted at right inguinal crease (3 mm) and left posterior neck (3 mm).  Skin:    General: Skin is warm and dry.     Comments: Thinning of hair in the midline occipital scalp  Neurological:     Mental Status: He is alert and oriented to person, place, and time.     Gait: Gait is intact.  Psychiatric:         Mood and Affect: Mood and affect normal.     Recent Labs     Component Value Date/Time   NA 139 04/15/2024 1344   K 4.0 04/15/2024 1344   CL 101 04/15/2024 1344   CO2 20 04/15/2024 1344   GLUCOSE 96 04/15/2024 1344   GLUCOSE 94 09/13/2019 0851   BUN 17 04/15/2024 1344   CREATININE 0.90 04/15/2024 1344   CALCIUM 10.1 04/15/2024 1344   PROT 7.8 04/15/2024 1344   ALBUMIN 5.1 04/15/2024 1344   AST 25 04/15/2024 1344   ALT 15 04/15/2024 1344   ALKPHOS 80 04/15/2024 1344   BILITOT 0.7 04/15/2024 1344    Lab Results  Component Value Date   WBC 8.8 04/15/2024   HGB 15.1 04/15/2024   HCT 45.6 04/15/2024   MCV 91 04/15/2024   PLT 265 04/15/2024   No results found for: HGBA1C Lab Results  Component Value Date   CHOL 148 09/13/2019   HDL 61.80 09/13/2019   LDLCALC 77 09/13/2019   TRIG 47.0 09/13/2019   CHOLHDL 2 09/13/2019   Lab Results  Component Value Date   TSH 1.020 04/15/2024        Assessment & Plan Palpable lymph node Patient reassured of nonconcerning texture and size of existing lymph nodes.  Follow clinically.  No need to repeat labs today but probably will recheck CBC at his physical with me in the fall.    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  Continue to use for another 3 months or so to assess effect.  If no benefit, probably will plan for low-dose oral finasteride. Orders:   Minoxidil  5 % FOAM; Apply 1 Application topically daily.    Follow-up as scheduled in October for CPE, sooner if needed   Rolan Hoyle, PA-C, DMSc, DipACLM, Nutritionist [facility] Primary Care and Sports Medicine [facility] [phone]      [1]  Current Meds  Medication Sig   albuterol  (VENTOLIN  HFA) 108 (90 Base) MCG/ACT inhaler Inhale 1-2 puffs into the lungs every 6 (six) hours as needed for wheezing or shortness of  breath.   MAGNESIUM PO Take by mouth.  Minoxidil  5 % FOAM Apply 1 Application topically daily.   VITAMIN D PO Take 1 tablet by mouth daily.   [DISCONTINUED] Minoxidil  (MINOXIDIL  FOR MEN) 5 % FOAM Apply topically.  [2] No Known Allergies [3]  Social History Tobacco Use   Smoking status: Never   Smokeless tobacco: Never  Vaping Use   Vaping status: Never Used  Substance Use Topics   Alcohol use: Never   Drug use: Never   "

## 2024-07-27 NOTE — Assessment & Plan Note (Signed)
 Patient currently using OTC minoxidil  5% foam, we will send a prescription for this to see if that makes it any cheaper for him.  Continue to use for another 3 months or so to assess effect.  If no benefit, probably will plan for low-dose oral finasteride. Orders:   Minoxidil  5 % FOAM; Apply 1 Application topically daily.

## 2025-04-21 ENCOUNTER — Encounter: Admitting: Physician Assistant
